# Patient Record
Sex: Female | Born: 1960 | Race: White | Hispanic: No | Marital: Married | Smoking: Former smoker
Health system: Southern US, Community
[De-identification: ages and names within clinical notes are randomized; demographics above are authoritative.]

## PROBLEM LIST (undated history)

## (undated) DIAGNOSIS — F32A Depression, unspecified: Secondary | ICD-10-CM

## (undated) DIAGNOSIS — J45909 Unspecified asthma, uncomplicated: Secondary | ICD-10-CM

## (undated) DIAGNOSIS — F329 Major depressive disorder, single episode, unspecified: Secondary | ICD-10-CM

## (undated) DIAGNOSIS — I219 Acute myocardial infarction, unspecified: Secondary | ICD-10-CM

## (undated) DIAGNOSIS — N83209 Unspecified ovarian cyst, unspecified side: Secondary | ICD-10-CM

## (undated) DIAGNOSIS — I10 Essential (primary) hypertension: Secondary | ICD-10-CM

## (undated) DIAGNOSIS — G459 Transient cerebral ischemic attack, unspecified: Secondary | ICD-10-CM

## (undated) DIAGNOSIS — H269 Unspecified cataract: Secondary | ICD-10-CM

## (undated) DIAGNOSIS — J449 Chronic obstructive pulmonary disease, unspecified: Secondary | ICD-10-CM

## (undated) DIAGNOSIS — E079 Disorder of thyroid, unspecified: Secondary | ICD-10-CM

## (undated) DIAGNOSIS — E119 Type 2 diabetes mellitus without complications: Secondary | ICD-10-CM

## (undated) HISTORY — DX: Essential (primary) hypertension: I10

## (undated) HISTORY — DX: Disorder of thyroid, unspecified: E07.9

## (undated) HISTORY — DX: Transient cerebral ischemic attack, unspecified: G45.9

## (undated) HISTORY — DX: Unspecified ovarian cyst, unspecified side: N83.209

## (undated) HISTORY — DX: Chronic obstructive pulmonary disease, unspecified: J44.9

## (undated) HISTORY — DX: Depression, unspecified: F32.A

## (undated) HISTORY — DX: Acute myocardial infarction, unspecified: I21.9

## (undated) HISTORY — DX: Type 2 diabetes mellitus without complications: E11.9

## (undated) HISTORY — PX: ABDOMINAL HYSTERECTOMY: SHX81

## (undated) HISTORY — PX: OTHER SURGICAL HISTORY: SHX169

## (undated) HISTORY — DX: Unspecified cataract: H26.9

## (undated) HISTORY — PX: CATARACT EXTRACTION, BILATERAL: SHX1313

## (undated) HISTORY — DX: Unspecified asthma, uncomplicated: J45.909

---
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
## 1998-07-20 ENCOUNTER — Emergency Department (HOSPITAL_COMMUNITY): Admission: EM | Admit: 1998-07-20 | Discharge: 1998-07-20 | Payer: Self-pay | Admitting: Emergency Medicine

## 1998-08-16 ENCOUNTER — Other Ambulatory Visit: Admission: RE | Admit: 1998-08-16 | Discharge: 1998-08-16 | Payer: Self-pay | Admitting: Internal Medicine

## 1998-12-16 ENCOUNTER — Emergency Department (HOSPITAL_COMMUNITY): Admission: EM | Admit: 1998-12-16 | Discharge: 1998-12-16 | Payer: Self-pay | Admitting: Emergency Medicine

## 1999-01-18 ENCOUNTER — Encounter: Admission: RE | Admit: 1999-01-18 | Discharge: 1999-04-18 | Payer: Self-pay | Admitting: Family Medicine

## 1999-09-05 ENCOUNTER — Other Ambulatory Visit: Admission: RE | Admit: 1999-09-05 | Discharge: 1999-09-05 | Payer: Self-pay | Admitting: Obstetrics and Gynecology

## 1999-10-30 ENCOUNTER — Encounter: Admission: RE | Admit: 1999-10-30 | Discharge: 1999-10-30 | Payer: Self-pay | Admitting: Internal Medicine

## 1999-10-30 ENCOUNTER — Encounter: Payer: Self-pay | Admitting: Internal Medicine

## 2000-11-06 ENCOUNTER — Emergency Department (HOSPITAL_COMMUNITY): Admission: EM | Admit: 2000-11-06 | Discharge: 2000-11-07 | Payer: Self-pay | Admitting: Emergency Medicine

## 2000-11-07 ENCOUNTER — Encounter: Payer: Self-pay | Admitting: Emergency Medicine

## 2001-01-01 ENCOUNTER — Encounter: Payer: Self-pay | Admitting: *Deleted

## 2001-01-01 ENCOUNTER — Encounter: Admission: RE | Admit: 2001-01-01 | Discharge: 2001-01-01 | Payer: Self-pay | Admitting: *Deleted

## 2001-01-22 ENCOUNTER — Encounter: Admission: RE | Admit: 2001-01-22 | Discharge: 2001-01-22 | Payer: Self-pay | Admitting: *Deleted

## 2001-01-22 ENCOUNTER — Encounter: Payer: Self-pay | Admitting: *Deleted

## 2001-01-26 ENCOUNTER — Emergency Department (HOSPITAL_COMMUNITY): Admission: EM | Admit: 2001-01-26 | Discharge: 2001-01-26 | Payer: Self-pay | Admitting: Emergency Medicine

## 2001-01-26 ENCOUNTER — Encounter: Payer: Self-pay | Admitting: Emergency Medicine

## 2001-01-28 ENCOUNTER — Encounter: Admission: RE | Admit: 2001-01-28 | Discharge: 2001-01-28 | Payer: Self-pay | Admitting: *Deleted

## 2001-01-28 ENCOUNTER — Encounter: Payer: Self-pay | Admitting: *Deleted

## 2003-07-10 ENCOUNTER — Emergency Department (HOSPITAL_COMMUNITY): Admission: EM | Admit: 2003-07-10 | Discharge: 2003-07-10 | Payer: Self-pay | Admitting: Emergency Medicine

## 2003-07-10 ENCOUNTER — Encounter: Payer: Self-pay | Admitting: Emergency Medicine

## 2003-11-27 ENCOUNTER — Emergency Department (HOSPITAL_COMMUNITY): Admission: EM | Admit: 2003-11-27 | Discharge: 2003-11-27 | Payer: Self-pay | Admitting: Emergency Medicine

## 2004-02-20 ENCOUNTER — Emergency Department (HOSPITAL_COMMUNITY): Admission: EM | Admit: 2004-02-20 | Discharge: 2004-02-20 | Payer: Self-pay | Admitting: Emergency Medicine

## 2004-03-01 ENCOUNTER — Encounter: Admission: RE | Admit: 2004-03-01 | Discharge: 2004-03-01 | Payer: Self-pay | Admitting: Orthopedic Surgery

## 2004-11-04 ENCOUNTER — Emergency Department (HOSPITAL_COMMUNITY): Admission: EM | Admit: 2004-11-04 | Discharge: 2004-11-04 | Payer: Self-pay | Admitting: Emergency Medicine

## 2005-03-17 ENCOUNTER — Other Ambulatory Visit: Admission: RE | Admit: 2005-03-17 | Discharge: 2005-03-17 | Payer: Self-pay | Admitting: Family Medicine

## 2005-03-20 ENCOUNTER — Encounter: Admission: RE | Admit: 2005-03-20 | Discharge: 2005-03-20 | Payer: Self-pay | Admitting: Family Medicine

## 2005-04-07 ENCOUNTER — Encounter: Admission: RE | Admit: 2005-04-07 | Discharge: 2005-07-06 | Payer: Self-pay | Admitting: Family Medicine

## 2007-01-16 ENCOUNTER — Emergency Department (HOSPITAL_COMMUNITY): Admission: EM | Admit: 2007-01-16 | Discharge: 2007-01-16 | Payer: Self-pay | Admitting: Podiatry

## 2007-05-03 ENCOUNTER — Emergency Department (HOSPITAL_COMMUNITY): Admission: EM | Admit: 2007-05-03 | Discharge: 2007-05-04 | Payer: Self-pay | Admitting: Emergency Medicine

## 2008-05-16 ENCOUNTER — Emergency Department (HOSPITAL_COMMUNITY): Admission: EM | Admit: 2008-05-16 | Discharge: 2008-05-16 | Payer: Self-pay | Admitting: Emergency Medicine

## 2008-09-26 ENCOUNTER — Encounter: Admission: RE | Admit: 2008-09-26 | Discharge: 2008-09-26 | Payer: Self-pay | Admitting: Family Medicine

## 2010-02-18 ENCOUNTER — Inpatient Hospital Stay (HOSPITAL_COMMUNITY): Admission: RE | Admit: 2010-02-18 | Discharge: 2010-02-21 | Payer: Self-pay | Admitting: Obstetrics and Gynecology

## 2010-02-18 ENCOUNTER — Encounter (INDEPENDENT_AMBULATORY_CARE_PROVIDER_SITE_OTHER): Payer: Self-pay | Admitting: Obstetrics and Gynecology

## 2010-03-09 ENCOUNTER — Inpatient Hospital Stay (HOSPITAL_COMMUNITY): Admission: AD | Admit: 2010-03-09 | Discharge: 2010-03-09 | Payer: Self-pay | Admitting: Obstetrics and Gynecology

## 2010-03-10 ENCOUNTER — Observation Stay (HOSPITAL_COMMUNITY): Admission: AD | Admit: 2010-03-10 | Discharge: 2010-03-12 | Payer: Self-pay | Admitting: Obstetrics and Gynecology

## 2010-03-11 ENCOUNTER — Ambulatory Visit: Payer: Self-pay | Admitting: Cardiology

## 2010-04-11 ENCOUNTER — Emergency Department (HOSPITAL_COMMUNITY): Admission: EM | Admit: 2010-04-11 | Discharge: 2010-04-11 | Payer: Self-pay | Admitting: Emergency Medicine

## 2010-07-04 ENCOUNTER — Emergency Department (HOSPITAL_COMMUNITY)
Admission: EM | Admit: 2010-07-04 | Discharge: 2010-07-04 | Payer: Self-pay | Source: Home / Self Care | Admitting: Emergency Medicine

## 2011-03-01 LAB — POCT I-STAT, CHEM 8
Calcium, Ion: 1.15 mmol/L (ref 1.12–1.32)
Glucose, Bld: 150 mg/dL — ABNORMAL HIGH (ref 70–99)
Hemoglobin: 12.6 g/dL (ref 12.0–15.0)

## 2011-03-01 LAB — URINALYSIS, ROUTINE W REFLEX MICROSCOPIC
Glucose, UA: NEGATIVE mg/dL
Hgb urine dipstick: NEGATIVE
Protein, ur: NEGATIVE mg/dL
Urobilinogen, UA: 0.2 mg/dL (ref 0.0–1.0)

## 2011-03-01 LAB — DIFFERENTIAL
Basophils Absolute: 0.1 10*3/uL (ref 0.0–0.1)
Eosinophils Absolute: 0.4 10*3/uL (ref 0.0–0.7)
Neutro Abs: 5.5 10*3/uL (ref 1.7–7.7)

## 2011-03-01 LAB — CBC
MCH: 25.8 pg — ABNORMAL LOW (ref 26.0–34.0)
Platelets: 457 10*3/uL — ABNORMAL HIGH (ref 150–400)
RBC: 4.41 MIL/uL (ref 3.87–5.11)

## 2011-03-01 LAB — URINE MICROSCOPIC-ADD ON

## 2011-03-01 LAB — POCT CARDIAC MARKERS: Troponin i, poc: <0.05 ng/mL (ref 0.00–0.09)

## 2011-03-04 LAB — BASIC METABOLIC PANEL
BUN: 17 mg/dL (ref 6–23)
CO2: 23 mEq/L (ref 19–32)
Calcium: 8.4 mg/dL (ref 8.4–10.5)
Creatinine, Ser: 0.66 mg/dL (ref 0.4–1.2)
GFR calc Af Amer: >60 mL/min (ref >60)
GFR calc non Af Amer: >60 mL/min (ref >60)
Glucose, Bld: 107 mg/dL — ABNORMAL HIGH (ref 70–99)
Sodium: 136 mEq/L (ref 135–145)

## 2011-03-04 LAB — POCT I-STAT, CHEM 8
Creatinine, Ser: 0.4 mg/dL (ref 0.4–1.2)
Glucose, Bld: 104 mg/dL — ABNORMAL HIGH (ref 70–99)
Hemoglobin: 10.5 g/dL — ABNORMAL LOW (ref 12.0–15.0)

## 2011-03-04 LAB — CBC
HCT: 29.3 % — ABNORMAL LOW (ref 36.0–46.0)
Hemoglobin: 9.8 g/dL — ABNORMAL LOW (ref 12.0–15.0)
RBC: 3.68 MIL/uL — ABNORMAL LOW (ref 3.87–5.11)
WBC: 10.5 10*3/uL (ref 4.0–10.5)

## 2011-03-04 LAB — DIFFERENTIAL
Basophils Absolute: 0.1 10*3/uL (ref 0.0–0.1)
Basophils Relative: 1 % (ref 0–1)
Lymphocytes Relative: 28 % (ref 12–46)
Lymphs Abs: 2.9 10*3/uL (ref 0.7–4.0)
Monocytes Absolute: 1.1 10*3/uL — ABNORMAL HIGH (ref 0.1–1.0)
Neutro Abs: 6.1 10*3/uL (ref 1.7–7.7)
Neutrophils Relative %: 58 % (ref 43–77)

## 2011-03-10 LAB — GLUCOSE, CAPILLARY
Glucose-Capillary: 108 mg/dL — ABNORMAL HIGH (ref 70–99)
Glucose-Capillary: 112 mg/dL — ABNORMAL HIGH (ref 70–99)
Glucose-Capillary: 113 mg/dL — ABNORMAL HIGH (ref 70–99)
Glucose-Capillary: 118 mg/dL — ABNORMAL HIGH (ref 70–99)
Glucose-Capillary: 130 mg/dL — ABNORMAL HIGH (ref 70–99)
Glucose-Capillary: 132 mg/dL — ABNORMAL HIGH (ref 70–99)
Glucose-Capillary: 135 mg/dL — ABNORMAL HIGH (ref 70–99)
Glucose-Capillary: 162 mg/dL — ABNORMAL HIGH (ref 70–99)
Glucose-Capillary: 201 mg/dL — ABNORMAL HIGH (ref 70–99)
Glucose-Capillary: 222 mg/dL — ABNORMAL HIGH (ref 70–99)
Glucose-Capillary: 230 mg/dL — ABNORMAL HIGH (ref 70–99)
Glucose-Capillary: 287 mg/dL — ABNORMAL HIGH (ref 70–99)
Glucose-Capillary: 88 mg/dL (ref 70–99)
Glucose-Capillary: 96 mg/dL (ref 70–99)

## 2011-03-10 LAB — COMPREHENSIVE METABOLIC PANEL
ALT: 22 U/L (ref 0–35)
AST: 27 U/L (ref 0–37)
Alkaline Phosphatase: 87 U/L (ref 39–117)
GFR calc Af Amer: >60 mL/min (ref >60)
Glucose, Bld: 194 mg/dL — ABNORMAL HIGH (ref 70–99)
Potassium: 4 mEq/L (ref 3.5–5.1)
Sodium: 138 mEq/L (ref 135–145)
Total Protein: 5.8 g/dL — ABNORMAL LOW (ref 6.0–8.3)

## 2011-03-10 LAB — BASIC METABOLIC PANEL
BUN: 10 mg/dL (ref 6–23)
BUN: 16 mg/dL (ref 6–23)
CO2: 25 mEq/L (ref 19–32)
CO2: 28 mEq/L (ref 19–32)
Calcium: 9 mg/dL (ref 8.4–10.5)
Chloride: 104 mEq/L (ref 96–112)
Chloride: 105 mEq/L (ref 96–112)
Chloride: 99 mEq/L (ref 96–112)
Creatinine, Ser: 0.58 mg/dL (ref 0.4–1.2)
GFR calc Af Amer: >60 mL/min (ref >60)
GFR calc Af Amer: >60 mL/min (ref >60)
Glucose, Bld: 109 mg/dL — ABNORMAL HIGH (ref 70–99)
Glucose, Bld: 191 mg/dL — ABNORMAL HIGH (ref 70–99)
Potassium: 3.6 mEq/L (ref 3.5–5.1)
Potassium: 4.1 mEq/L (ref 3.5–5.1)
Potassium: 4.5 mEq/L (ref 3.5–5.1)
Sodium: 137 mEq/L (ref 135–145)

## 2011-03-10 LAB — CBC
HCT: 26.6 % — ABNORMAL LOW (ref 36.0–46.0)
HCT: 28.5 % — ABNORMAL LOW (ref 36.0–46.0)
HCT: 30.7 % — ABNORMAL LOW (ref 36.0–46.0)
HCT: 35.9 % — ABNORMAL LOW (ref 36.0–46.0)
Hemoglobin: 8.6 g/dL — ABNORMAL LOW (ref 12.0–15.0)
Hemoglobin: 9.2 g/dL — ABNORMAL LOW (ref 12.0–15.0)
MCHC: 32.4 g/dL (ref 30.0–36.0)
MCV: 81.4 fL (ref 78.0–100.0)
MCV: 83.1 fL (ref 78.0–100.0)
MCV: 83.5 fL (ref 78.0–100.0)
Platelets: 459 10*3/uL — ABNORMAL HIGH (ref 150–400)
Platelets: 570 10*3/uL — ABNORMAL HIGH (ref 150–400)
Platelets: 613 10*3/uL — ABNORMAL HIGH (ref 150–400)
RBC: 3.67 MIL/uL — ABNORMAL LOW (ref 3.87–5.11)
RDW: 15.8 % — ABNORMAL HIGH (ref 11.5–15.5)
WBC: 10.2 10*3/uL (ref 4.0–10.5)
WBC: 15.8 10*3/uL — ABNORMAL HIGH (ref 4.0–10.5)
WBC: 8.3 10*3/uL (ref 4.0–10.5)

## 2011-03-10 LAB — MRSA PCR SCREENING: MRSA by PCR: NEGATIVE

## 2011-04-16 ENCOUNTER — Emergency Department (HOSPITAL_COMMUNITY): Payer: BC Managed Care – PPO

## 2011-04-16 ENCOUNTER — Inpatient Hospital Stay (HOSPITAL_COMMUNITY)
Admission: EM | Admit: 2011-04-16 | Discharge: 2011-04-21 | DRG: 122 | Disposition: A | Discharge status: Home / Self Care | Payer: BC Managed Care – PPO | Source: Ambulatory Visit | Attending: Family Medicine | Admitting: Family Medicine

## 2011-04-16 DIAGNOSIS — E119 Type 2 diabetes mellitus without complications: Secondary | ICD-10-CM | POA: Diagnosis present

## 2011-04-16 DIAGNOSIS — Z7902 Long term (current) use of antithrombotics/antiplatelets: Secondary | ICD-10-CM

## 2011-04-16 DIAGNOSIS — E7800 Pure hypercholesterolemia, unspecified: Secondary | ICD-10-CM | POA: Diagnosis present

## 2011-04-16 DIAGNOSIS — I10 Essential (primary) hypertension: Secondary | ICD-10-CM | POA: Diagnosis present

## 2011-04-16 DIAGNOSIS — J449 Chronic obstructive pulmonary disease, unspecified: Secondary | ICD-10-CM | POA: Diagnosis present

## 2011-04-16 DIAGNOSIS — R55 Syncope and collapse: Secondary | ICD-10-CM | POA: Diagnosis present

## 2011-04-16 DIAGNOSIS — J4489 Other specified chronic obstructive pulmonary disease: Secondary | ICD-10-CM | POA: Diagnosis present

## 2011-04-16 DIAGNOSIS — I2510 Atherosclerotic heart disease of native coronary artery without angina pectoris: Secondary | ICD-10-CM | POA: Diagnosis present

## 2011-04-16 DIAGNOSIS — I214 Non-ST elevation (NSTEMI) myocardial infarction: Principal | ICD-10-CM | POA: Diagnosis present

## 2011-04-16 DIAGNOSIS — K219 Gastro-esophageal reflux disease without esophagitis: Secondary | ICD-10-CM | POA: Diagnosis present

## 2011-04-16 DIAGNOSIS — F3289 Other specified depressive episodes: Secondary | ICD-10-CM | POA: Diagnosis present

## 2011-04-16 DIAGNOSIS — F329 Major depressive disorder, single episode, unspecified: Secondary | ICD-10-CM | POA: Diagnosis present

## 2011-04-16 DIAGNOSIS — N318 Other neuromuscular dysfunction of bladder: Secondary | ICD-10-CM | POA: Diagnosis present

## 2011-04-16 DIAGNOSIS — E039 Hypothyroidism, unspecified: Secondary | ICD-10-CM | POA: Diagnosis present

## 2011-04-16 DIAGNOSIS — E8881 Metabolic syndrome: Secondary | ICD-10-CM | POA: Diagnosis present

## 2011-04-16 DIAGNOSIS — F17200 Nicotine dependence, unspecified, uncomplicated: Secondary | ICD-10-CM | POA: Diagnosis present

## 2011-04-16 DIAGNOSIS — E785 Hyperlipidemia, unspecified: Secondary | ICD-10-CM | POA: Diagnosis present

## 2011-04-16 DIAGNOSIS — G4733 Obstructive sleep apnea (adult) (pediatric): Secondary | ICD-10-CM | POA: Diagnosis present

## 2011-04-16 LAB — CBC
HCT: 39.4 % (ref 36.0–46.0)
Hemoglobin: 13.7 g/dL (ref 12.0–15.0)
MCHC: 34.8 g/dL (ref 30.0–36.0)
MCV: 88.5 fL (ref 78.0–100.0)
RDW: 14.2 % (ref 11.5–15.5)
WBC: 10.7 10*3/uL — ABNORMAL HIGH (ref 4.0–10.5)

## 2011-04-16 LAB — RAPID URINE DRUG SCREEN, HOSP PERFORMED
Amphetamines: NOT DETECTED
Benzodiazepines: NOT DETECTED
Cocaine: NOT DETECTED
Opiates: NOT DETECTED
Tetrahydrocannabinol: NOT DETECTED

## 2011-04-16 LAB — DIFFERENTIAL
Basophils Absolute: 0.1 10*3/uL (ref 0.0–0.1)
Eosinophils Relative: 2 % (ref 0–5)
Lymphocytes Relative: 31 % (ref 12–46)
Lymphs Abs: 3.3 10*3/uL (ref 0.7–4.0)
Monocytes Absolute: 1 10*3/uL (ref 0.1–1.0)
Neutro Abs: 6.1 10*3/uL (ref 1.7–7.7)

## 2011-04-16 LAB — BASIC METABOLIC PANEL
BUN: 20 mg/dL (ref 6–23)
CO2: 21 mEq/L (ref 19–32)
GFR calc non Af Amer: >60 mL/min (ref >60)
Glucose, Bld: 166 mg/dL — ABNORMAL HIGH (ref 70–99)
Potassium: 3.6 mEq/L (ref 3.5–5.1)
Sodium: 134 mEq/L — ABNORMAL LOW (ref 135–145)

## 2011-04-16 LAB — LIPID PANEL
Cholesterol: 227 mg/dL — ABNORMAL HIGH (ref 0–200)
HDL: 28 mg/dL — ABNORMAL LOW (ref >39)
LDL Cholesterol: UNDETERMINED mg/dL (ref 0–99)
Total CHOL/HDL Ratio: 8.1 RATIO
Triglycerides: 436 mg/dL — ABNORMAL HIGH (ref <150)

## 2011-04-16 LAB — URINALYSIS, ROUTINE W REFLEX MICROSCOPIC
Bilirubin Urine: NEGATIVE
Hgb urine dipstick: NEGATIVE
Ketones, ur: 15 mg/dL — AB
Specific Gravity, Urine: 1.026 (ref 1.005–1.030)
pH: 5.5 (ref 5.0–8.0)

## 2011-04-16 LAB — POCT CARDIAC MARKERS: Troponin i, poc: <0.05 ng/mL (ref 0.00–0.09)

## 2011-04-16 LAB — GLUCOSE, CAPILLARY

## 2011-04-17 LAB — GLUCOSE, CAPILLARY
Glucose-Capillary: 146 mg/dL — ABNORMAL HIGH (ref 70–99)
Glucose-Capillary: 155 mg/dL — ABNORMAL HIGH (ref 70–99)
Glucose-Capillary: 242 mg/dL — ABNORMAL HIGH (ref 70–99)

## 2011-04-17 LAB — PROTIME-INR
INR: 1.08 (ref 0.00–1.49)
Prothrombin Time: 14.2 seconds (ref 11.6–15.2)

## 2011-04-17 LAB — HEMOGLOBIN A1C
Hgb A1c MFr Bld: 9 % — ABNORMAL HIGH (ref <5.7)
Mean Plasma Glucose: 212 mg/dL — ABNORMAL HIGH (ref <117)

## 2011-04-17 LAB — DIFFERENTIAL
Basophils Absolute: 0 10*3/uL (ref 0.0–0.1)
Eosinophils Relative: 5 % (ref 0–5)
Lymphocytes Relative: 40 % (ref 12–46)
Neutro Abs: 3.5 10*3/uL (ref 1.7–7.7)
Neutrophils Relative %: 47 % (ref 43–77)

## 2011-04-17 LAB — CBC
HCT: 38.5 % (ref 36.0–46.0)
RBC: 4.26 MIL/uL (ref 3.87–5.11)
RDW: 14.5 % (ref 11.5–15.5)
WBC: 7.5 10*3/uL (ref 4.0–10.5)

## 2011-04-17 LAB — CARDIAC PANEL(CRET KIN+CKTOT+MB+TROPI)
CK, MB: 1.5 ng/mL (ref 0.3–4.0)
Relative Index: INVALID (ref 0.0–2.5)
Relative Index: INVALID (ref 0.0–2.5)
Relative Index: INVALID (ref 0.0–2.5)
Troponin I: 1.3 ng/mL (ref <0.30)
Troponin I: 1.26 ng/mL (ref <0.30)

## 2011-04-18 DIAGNOSIS — R55 Syncope and collapse: Secondary | ICD-10-CM

## 2011-04-18 LAB — GLUCOSE, CAPILLARY: Glucose-Capillary: 182 mg/dL — ABNORMAL HIGH (ref 70–99)

## 2011-04-18 LAB — COMPREHENSIVE METABOLIC PANEL
AST: 79 U/L — ABNORMAL HIGH (ref 0–37)
Albumin: 3.1 g/dL — ABNORMAL LOW (ref 3.5–5.2)
Calcium: 8.8 mg/dL (ref 8.4–10.5)
Chloride: 106 mEq/L (ref 96–112)
Creatinine, Ser: 0.65 mg/dL (ref 0.4–1.2)
GFR calc Af Amer: >60 mL/min (ref >60)

## 2011-04-19 ENCOUNTER — Inpatient Hospital Stay (HOSPITAL_COMMUNITY): Payer: BC Managed Care – PPO

## 2011-04-19 LAB — GLUCOSE, CAPILLARY
Glucose-Capillary: 176 mg/dL — ABNORMAL HIGH (ref 70–99)
Glucose-Capillary: 287 mg/dL — ABNORMAL HIGH (ref 70–99)
Glucose-Capillary: 218 mg/dL — ABNORMAL HIGH (ref 70–99)

## 2011-04-19 LAB — CARDIAC PANEL(CRET KIN+CKTOT+MB+TROPI): Total CK: 86 U/L (ref 7–177)

## 2011-04-19 LAB — COMPREHENSIVE METABOLIC PANEL
Alkaline Phosphatase: 93 U/L (ref 39–117)
Alkaline Phosphatase: 93 U/L (ref 39–117)
BUN: 13 mg/dL (ref 6–23)
BUN: 11 mg/dL (ref 6–23)
CO2: 28 mEq/L (ref 19–32)
Calcium: 9.3 mg/dL (ref 8.4–10.5)
GFR calc non Af Amer: >60 mL/min (ref >60)
Glucose, Bld: 160 mg/dL — ABNORMAL HIGH (ref 70–99)
Glucose, Bld: 258 mg/dL — ABNORMAL HIGH (ref 70–99)
Potassium: 3.8 mEq/L (ref 3.5–5.1)
Total Bilirubin: 0.3 mg/dL (ref 0.3–1.2)
Total Protein: 6.8 g/dL (ref 6.0–8.3)
Total Protein: 6.6 g/dL (ref 6.0–8.3)

## 2011-04-19 LAB — CBC
MCH: 29.7 pg (ref 26.0–34.0)
Platelets: 300 10*3/uL (ref 150–400)
RBC: 4.14 MIL/uL (ref 3.87–5.11)
RDW: 14.2 % (ref 11.5–15.5)
WBC: 6.7 10*3/uL (ref 4.0–10.5)

## 2011-04-19 LAB — D-DIMER, QUANTITATIVE: D-Dimer, Quant: 4 ug/mL-FEU — ABNORMAL HIGH (ref 0.00–0.48)

## 2011-04-20 ENCOUNTER — Inpatient Hospital Stay (HOSPITAL_COMMUNITY): Payer: BC Managed Care – PPO

## 2011-04-20 LAB — GLUCOSE, CAPILLARY
Glucose-Capillary: 130 mg/dL — ABNORMAL HIGH (ref 70–99)
Glucose-Capillary: 229 mg/dL — ABNORMAL HIGH (ref 70–99)
Glucose-Capillary: 193 mg/dL — ABNORMAL HIGH (ref 70–99)

## 2011-04-20 LAB — CBC
MCHC: 32.8 g/dL (ref 30.0–36.0)
RDW: 14.5 % (ref 11.5–15.5)
WBC: 10 10*3/uL (ref 4.0–10.5)

## 2011-04-20 LAB — CARDIAC PANEL(CRET KIN+CKTOT+MB+TROPI): CK, MB: 1.4 ng/mL (ref 0.3–4.0)

## 2011-04-20 LAB — COMPREHENSIVE METABOLIC PANEL
BUN: 10 mg/dL (ref 6–23)
Calcium: 9 mg/dL (ref 8.4–10.5)
Creatinine, Ser: 0.49 mg/dL (ref 0.4–1.2)
Glucose, Bld: 133 mg/dL — ABNORMAL HIGH (ref 70–99)
Total Protein: 6.6 g/dL (ref 6.0–8.3)

## 2011-04-20 LAB — PROTIME-INR
INR: 1.05 (ref 0.00–1.49)
Prothrombin Time: 13.9 seconds (ref 11.6–15.2)

## 2011-04-20 MED ORDER — XENON XE 133 GAS
10.0000 | GAS_FOR_INHALATION | Freq: Once | RESPIRATORY_TRACT | Status: AC | PRN
  Administered 2011-04-20: 10 via RESPIRATORY_TRACT

## 2011-04-20 MED ORDER — TECHNETIUM TO 99M ALBUMIN AGGREGATED
6.0000 | Freq: Once | INTRAVENOUS | Status: AC | PRN
  Administered 2011-04-20: 6 via INTRAVENOUS

## 2011-04-21 ENCOUNTER — Inpatient Hospital Stay (HOSPITAL_COMMUNITY): Payer: BC Managed Care – PPO

## 2011-04-21 LAB — BASIC METABOLIC PANEL WITH GFR
BUN: 11 mg/dL (ref 6–23)
CO2: 27 meq/L (ref 19–32)
Calcium: 9.1 mg/dL (ref 8.4–10.5)
Chloride: 102 meq/L (ref 96–112)
Creatinine, Ser: 0.57 mg/dL (ref 0.4–1.2)
GFR calc non Af Amer: >60 mL/min
Glucose, Bld: 149 mg/dL — ABNORMAL HIGH (ref 70–99)
Potassium: 4.2 meq/L (ref 3.5–5.1)
Sodium: 136 meq/L (ref 135–145)

## 2011-04-21 LAB — PROTIME-INR
INR: 1.1 (ref 0.00–1.49)
Prothrombin Time: 14.4 s (ref 11.6–15.2)

## 2011-04-21 LAB — GLUCOSE, CAPILLARY: Glucose-Capillary: 146 mg/dL — ABNORMAL HIGH (ref 70–99)

## 2011-04-22 NOTE — Procedures (Unsigned)
REFERRING PHYSICIAN:  John C.  HISTORY:  A 50 year old woman admitted for the chest pain status post cardiac catheterization.  The patient had episodes of dizziness and unresponsiveness with shaking without any postictal period.  EEG has been requested for further evaluation.  MEDICATIONS:  Lopressor, vitamin B12, Paxil, Zocor, Vasotec, Flovent, Ventolin, levothyroxine, ferrous sulfate, Lovenox, Amaryl, K-Dur, Ativan, and Percocet.  EEG DURATION:  21.5 minutes of EEG recording.  EEG DESCRIPTION:  This is a routine 18 channel adult EEG recording with one channel devoted to limited EKG recording.  Activation procedure was performed during the photic stimulation and the study is performed in awake and drowsy state. As the EEG opens up, I noticed a posterior dominant rhythm is 9 Hz frequency with an amplitude of 15-28 microvolts.  The rhythm is symmetrical and continuous.  There is mild sub harmonic driving noted with the photic stimulation in posterior lead.  There is also significant EMG, artifact throughout the study.  I do notice the vertex waves and well-formed synchronous spindles as the patient got drowsy during the study.  There is no evidence to suggest epileptiform discharges or electrographic seizures on this study.  EEG INTERPRETATION:  This is a normal awake and drowsy EEG.  There is no evidence to suggest electrographic seizures or epileptiform discharges based on today's study.          ______________________________ Levie Heritage, MD    [id number] D:  04/21/2011 13:01:37  T:  04/22/2011 01:06:35  Job #:  [id number]

## 2011-04-22 NOTE — Discharge Summary (Signed)
[name], INFANTINO                ACCOUNT NO.:  [id number]  MEDICAL RECORD NO.:  [id number]           PATIENT TYPE:  I  LOCATION:  2030                         FACILITY:  [facility]  PHYSICIAN:  Standley Dakins, MD   DATE OF BIRTH:  1961-11-30  DATE OF ADMISSION:  04/16/2011 DATE OF DISCHARGE:  04/21/2011                        DISCHARGE SUMMARY - CLOSE OUTPATIENT FOLLOWUP   PRIMARY CARE PROVIDER:  Fleet Contras, MD.  The patient will follow up with him in 10 days.  CARDIOLOGISTEduardo Osier Sharyn Lull, MD  DISCHARGE MEDICATIONS: 1. Levothyroxine 200 mcg 1 p.o. daily. 2. Metoprolol 25 mg 1 p.o. b.i.d. 3. Plavix 75 mg 1 p.o. daily. 4. Detrol LA 4 mg 1 p.o. daily. 5. Enalapril 5 mg 1 p.o. daily. 6. Fexofenadine OTC b.i.d. p.r.n. 7. Flovent 2 puffs inhaled daily. 8. Lovastatin 40 mg 1 p.o. daily at bedtime. 9. Metformin 1000 mg 1 p.o. b.i.d. 10.Paroxetine 20 mg 1 p.o. daily. 11.ProAir inhaler 2 puffs q.4 h. p.r.n.  DISCHARGE DIAGNOSES: 1. Non-ST elevation myocardial infarction. 2. Metabolic syndrome. 3. Dyslipidemia. 4. Hypothyroidism - uncontrolled. 5. Obstructive sleep apnea. 6. Hypertension. 7. Morbid obesity. 8. Overactive bladder and urge incontinence. 9. Asthma. 10.Depression. 11.Diabetes mellitus type 2, poorly controlled, non-insulin requiring.  HOSPITAL COURSE:  Briefly, this patient was admitted into the hospital with chest pain 10/10 that radiated into her back.  She was admitted into the hospital and cardiac enzymes were monitored serially.  She began to develop elevated troponin levels and cardiology consultation was obtained.  The patient was taken for cardiac catheterization by Dr. Sharyn Lull.  Their findings revealed left ventricle showed good LV systolic function with an EF of 55%.  Left main was short but patent.  LAD was large and patent.  Diagonal 1 and 2 were moderate size and were patent. Diagonal 3 was also patent.  The left circumflex was patent as  well. RCA was large and patent.    The patient was monitored in the hospital. She began to develop several episodes of orthostatic vasovagal syncope and passed out several times in the hospital.  I talked with a neurologist who recommended that she have some outpatient followup with a neurologist to evaluate her syncopal episodes.  He thought that they were orthostatic and that outpatient followup was appropriate.  The patient has been worked up for this approximately 1 year ago for similar episodes.  The patient improved considerably after fluid boluses.  In addition, the patient had an elevated D-dimer.    A V/Q scan was obtained and was negative for pulmonary embolus.  The patient had recurrent episodes of chest pain and an EKG was obtained and cardiac enzymes were obtained and there was no acute findings.  The patient does have obstructive sleep apnea and was found to have uncontrolled diabetes and her hypothyroidism was also uncontrolled.  I have recommended that she follow up with a primary care provider.  We arranged an outpatient appointment for her.  Also she will follow up with Dr. Sharyn Lull, her cardiologist that saw her in the hospital and have recommended an outpatient appointment for her to see [facility]  for evaluation of her syncopal episodes.  The patient was discharged in stable condition.  FOLLOWUP:  She is going to follow up with Dr. Sharyn Lull, her cardiologist. I have recommended 7-10 days for followup.  She will follow up with Dr. Concepcion Elk for primary care as scheduled in approximately 10 days.  Also I have recommended followup with [facility] for evaluation of her syncopal episodes thought to be vasovagal syncope.  SPECIAL INSTRUCTIONS:  Please take your medications as prescribed. Please follow up with the appointment scheduled and please monitor blood glucose by checking blood sugars at least one time per day and as needed.  Also,  fall precautions strongly recommended as the patient has history of vaso-vagal syncopal episodes.  I have strongly recommended that the patient have an outpatient sleep study and be fitted for CPAP.  The patient has been diagnosed with sleep apnea in the past and has not been compliant with CPAP.  I spent 35 mins preparing the discharge, writing prescriptions and  arranging follow-up.     Standley Dakins, MD, CDE, FAAFP     CJ/MEDQ  D:  04/21/2011  T:  04/21/2011  Job:  [id number]  cc:   [facility] Eduardo Osier. Sharyn Lull, M.D. Fleet Contras, M.D.  Electronically Signed by Standley Dakins  on 04/22/2011 04:48:37 PM
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
## 2011-04-29 NOTE — Procedures (Signed)
[name], [name]                ACCOUNT NO.:  [id number]   MEDICAL RECORD NO.:  [id number]          PATIENT TYPE:  OUT   LOCATION:  SLEEP CENTER                 FACILITY:  [facility]   PHYSICIAN:  Barbaraann Share, MD,FCCPDATE OF BIRTH:  1961/12/07   DATE OF STUDY:  06/25/2008                            NOCTURNAL POLYSOMNOGRAM   REFERRING PHYSICIAN:   INDICATION FOR STUDY:  Hypersomnia with sleep apnea.  The patient has  documented sleep apnea and returns for pressure optimization.   EPWORTH SLEEPINESS SCORE:  Two.   SLEEP ARCHITECTURE:  The patient had a total sleep time of 237 minutes  with no slow-wave sleep and decreased quantity of REM.  Sleep onset  latency was prolonged at 48 minutes, and REM onset was very prolonged at  278 minutes.  Sleep efficiency was very decreased at 60%.   RESPIRATORY DATA:  The patient was fitted with an extra small Quattro  full face mask at the start of the study, and the pressure was then  increased incrementally to control both obstructive events and snoring.  At a final pressure of 10 cm of water, the patient had excellent control  of her sleep apnea even through REM.  It should be noted, however, there  was no supine sleep time on the final CPAP pressure.   OXYGEN DATA:  There was O2 desaturation as low as 88% prior to reaching  optimal CPAP pressure.   CARDIAC DATA:  No clinically significant arrhythmias were noted.   MOVEMENT/PARASOMNIA:  There were no abnormal leg jerks or behaviors  noted.   IMPRESSION/RECOMMENDATION:  Good control of previously documented  obstructive sleep apnea with 10 cm of continuous positive airway  pressure delivered by an extra small Quattro full face mask.  The  patient had excellent control during rapid eye movement but never had  supine sleep on the final CPAP pressure.  She should be encouraged to  work aggressively on weight loss.      Barbaraann Share, MD,FCCP  Diplomate, American Board of Sleep  Medicine  Electronically Signed    [facility]/MEDQ  D:  07/06/2008 19:35:18  T:  07/06/2008 20:07:32  Job:  [id number]

## 2011-04-29 NOTE — Procedures (Signed)
[name], [name]                ACCOUNT NO.:  [id number]   MEDICAL RECORD NO.:  [id number]          PATIENT TYPE:  OUT   LOCATION:  SLEEP CENTER                 FACILITY:  [facility]   PHYSICIAN:  Barbaraann Share, MD,FCCPDATE OF BIRTH:  1961-02-21   DATE OF STUDY:  06/25/2008                            NOCTURNAL POLYSOMNOGRAM   REFERRING PHYSICIAN:   This is the second dictation.  The first one was apparently lost and  never came through.   REFERRING PHYSICIAN:  Arta Silence, M.D.   INDICATION FOR STUDY:  Hypersomnia with sleep apnea.  Patient returns  for formal CPAP titration.   EPWORTH SLEEPINESS SCORE:  2.   SLEEP ARCHITECTURE:  Patient had a total sleep time of 237 minutes with  no slow wave sleep and decreased REM.  Sleep onset latency was prolonged  at 48 minutes, and REM onset was prolonged at 278 minutes.  Sleep  efficiency was decreased at 60%.   RESPIRATORY DATA:  The patient underwent a CPAP titration study and was  fitted with a ResMed Quattro full face mask, size extra small.  Patient  was started on a pressure of 5 cm/H2O and gradually increased to 10 cm  to treat both obstructive events and snoring.  On the final CPAP  pressure, the patient began to have REM.  Overall, tolerance appeared to  be excellent.   OXYGEN DATA:  The patient had transient O2 desaturation as low as 88%  prior to reaching optimal CPAP pressure.   CARDIAC DATA:  No cardiac arrhythmias were noted.   MOVEMENT-PARASOMNIA:  The patient was found to have no significant leg  jerks or abnormal behavior.   IMPRESSIONS-RECOMMENDATIONS:  Good control of previously documented  obstructive sleep apnea with 10 cm of CPAP delivered by an extra small  ResMed Quattro full face mask.  Patient should also be encouraged to  work aggressively on weight loss.      Barbaraann Share, MD,FCCP  Diplomate, American Board of Sleep  Medicine  Electronically Signed     [facility]/MEDQ  D:  07/25/2008  15:00:23  T:  07/25/2008 15:40:35  Job:  [id number]
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
## 2011-09-11 LAB — CBC
HCT: 36.8
Hemoglobin: 12.5
MCV: 86.5
RBC: 4.25
WBC: 13.9 — ABNORMAL HIGH

## 2011-09-11 LAB — URINALYSIS, ROUTINE W REFLEX MICROSCOPIC
Bilirubin Urine: NEGATIVE
Specific Gravity, Urine: 1.01
Urobilinogen, UA: 0.2
pH: 5.5

## 2011-09-11 LAB — COMPREHENSIVE METABOLIC PANEL
Alkaline Phosphatase: 117
BUN: 11
CO2: 24
Chloride: 99
Creatinine, Ser: 0.91
GFR calc non Af Amer: >60
Total Bilirubin: 1

## 2011-09-11 LAB — URINE MICROSCOPIC-ADD ON

## 2011-09-11 LAB — POCT CARDIAC MARKERS
Operator id: 275321
Troponin i, poc: <0.05

## 2011-09-11 LAB — DIFFERENTIAL
Basophils Absolute: 0.1
Basophils Relative: 1
Eosinophils Relative: 1
Lymphocytes Relative: 17
Neutro Abs: 9.5 — ABNORMAL HIGH

## 2011-09-11 LAB — URINE CULTURE

## 2013-02-02 IMAGING — NM NM PULMONARY VENT & PERF
2 series · 12 of 12 positions shown · non-contrast
Comparison: Chest radiograph from 04/16/2011

CLINICAL DATA: 50-year-old with elevated D-dimer.

NM PULMONARY VENTILATION AND PERFUSION SCAN
Radiopharmaceutical: 10 mCi xenon-822,  6 mCi technetium 99m MAA

[vq scan · 3.20mm/px · 6 of 20 frames shown (1 of 2)]
[frame 2/20  full-range]
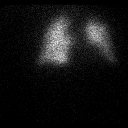
[frame 5/20  full-range]
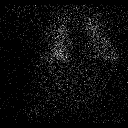
[frame 9/20  full-range]
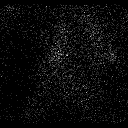
[frame 12/20]
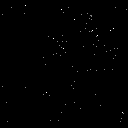
[frame 15/20]
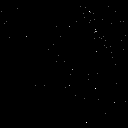
[frame 19/20  full-range]
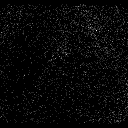

[vq scan · 3.20mm/px · 6 of 20 frames shown (2 of 2)]
[frame 2/20  full-range]
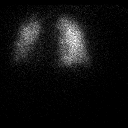
[frame 5/20  full-range]
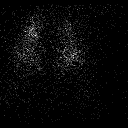
[frame 9/20  full-range]
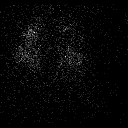
[frame 12/20]
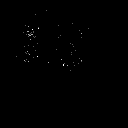
[frame 15/20]
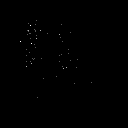
[frame 19/20]
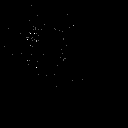

[12 of 12 positions shown; findings below may reference images not displayed]

FINDINGS: The ventilation images are normal.  The perfusion images
are essentially normal.  There is no significant peripheral or
wedge-shaped perfusion abnormality.  No evidence for a ventilation-
perfusion mismatch.
IMPRESSION: Very low probability for pulmonary embolism.

## 2020-01-06 ENCOUNTER — Other Ambulatory Visit: Payer: Self-pay

## 2020-01-06 MED ORDER — HEPARIN SODIUM (PORCINE) 5000 UNIT/ML IJ SOLN
5000.00 | INTRAMUSCULAR | Status: DC
Start: 2020-01-06 — End: 2020-01-06

## 2020-01-06 MED ORDER — HYDROCODONE-ACETAMINOPHEN 5-325 MG PO TABS
1.00 | ORAL_TABLET | ORAL | Status: DC
Start: ? — End: 2020-01-06

## 2020-01-06 MED ORDER — DEXTROSE 10 % IV SOLN
125.00 | INTRAVENOUS | Status: DC
Start: ? — End: 2020-01-06

## 2020-01-06 MED ORDER — NYSTATIN 100000 UNIT/GM EX CREA
TOPICAL_CREAM | CUTANEOUS | Status: DC
Start: 2020-01-06 — End: 2020-01-06

## 2020-01-06 MED ORDER — ENALAPRIL MALEATE 5 MG PO TABS
5.00 | ORAL_TABLET | ORAL | Status: DC
Start: 2020-01-07 — End: 2020-01-06

## 2020-01-06 MED ORDER — CLOPIDOGREL BISULFATE 75 MG PO TABS
75.00 | ORAL_TABLET | ORAL | Status: DC
Start: 2020-01-07 — End: 2020-01-06

## 2020-01-06 MED ORDER — OXYBUTYNIN CHLORIDE 5 MG PO TABS
5.00 | ORAL_TABLET | ORAL | Status: DC
Start: 2020-01-06 — End: 2020-01-06

## 2020-01-06 MED ORDER — TIZANIDINE HCL 4 MG PO TABS
4.00 | ORAL_TABLET | ORAL | Status: DC
Start: ? — End: 2020-01-06

## 2020-01-06 MED ORDER — GLUCOSE 40 % PO GEL
15.00 | ORAL | Status: DC
Start: ? — End: 2020-01-06

## 2020-01-06 MED ORDER — LEVOTHYROXINE SODIUM 100 MCG PO TABS
200.00 | ORAL_TABLET | ORAL | Status: DC
Start: 2020-01-07 — End: 2020-01-06

## 2020-01-06 MED ORDER — GLIPIZIDE ER 5 MG PO TB24
5.00 | ORAL_TABLET | ORAL | Status: DC
Start: 2020-01-07 — End: 2020-01-06

## 2020-01-06 MED ORDER — GLUCAGON (RDNA) 1 MG IJ KIT
1.00 | PACK | INTRAMUSCULAR | Status: DC
Start: ? — End: 2020-01-06

## 2020-01-06 MED ORDER — INSULIN LISPRO 100 UNIT/ML ~~LOC~~ SOLN
1.00 | SUBCUTANEOUS | Status: DC
Start: 2020-01-07 — End: 2020-01-06

## 2020-01-06 MED ORDER — ALBUTEROL SULFATE HFA 108 (90 BASE) MCG/ACT IN AERS
2.00 | INHALATION_SPRAY | RESPIRATORY_TRACT | Status: DC
Start: 2020-01-06 — End: 2020-01-06

## 2020-01-06 MED ORDER — GABAPENTIN 300 MG PO CAPS
300.00 | ORAL_CAPSULE | ORAL | Status: DC
Start: 2020-01-06 — End: 2020-01-06

## 2020-01-06 MED ORDER — METOPROLOL TARTRATE 25 MG PO TABS
25.00 | ORAL_TABLET | ORAL | Status: DC
Start: 2020-01-06 — End: 2020-01-06

## 2020-01-06 MED ORDER — FLUTICASONE FUROATE 100 MCG/ACT IN AEPB
1.00 | INHALATION_SPRAY | RESPIRATORY_TRACT | Status: DC
Start: 2020-01-07 — End: 2020-01-06

## 2020-01-06 MED ORDER — ACETAMINOPHEN 325 MG PO TABS
650.00 | ORAL_TABLET | ORAL | Status: DC
Start: ? — End: 2020-01-06

## 2020-01-06 MED ORDER — GENERIC EXTERNAL MEDICATION
1.00 | Status: DC
Start: 2020-01-06 — End: 2020-01-06

## 2020-01-06 MED ORDER — DULOXETINE HCL 30 MG PO CPEP
30.00 | ORAL_CAPSULE | ORAL | Status: DC
Start: 2020-01-07 — End: 2020-01-06

## 2020-01-06 MED ORDER — ATORVASTATIN CALCIUM 40 MG PO TABS
40.00 | ORAL_TABLET | ORAL | Status: DC
Start: 2020-01-06 — End: 2020-01-06

## 2020-01-06 NOTE — Patient Outreach (Signed)
  [facility]) Care Management Chronic Special Needs Program    01/06/2020  Name: [name], DOB: 19-Sep-1961  MRN: [id number]   Ms. [name] is enrolled in a chronic special needs plan for Diabetes. Client admitted to [facility] center on 01/03/20. Individualized care plan sent to [facility] center utilization management department. Individualized care plan developed based upon HRA and available data.   PLAN: RNCM will continue to follow.   George Ina RN,BSN,CCM Chronic Care Management Coordinator [facility] Care Management [phone]

## 2020-01-10 ENCOUNTER — Other Ambulatory Visit: Payer: Self-pay

## 2020-01-10 ENCOUNTER — Ambulatory Visit: Payer: Self-pay

## 2020-01-10 NOTE — Patient Outreach (Signed)
Assigned General Discharge follow up Emmi call to [name] after discharging from [facility].

## 2020-01-10 NOTE — Patient Outreach (Signed)
  [facility]) Care Management Chronic Special Needs Program    01/10/2020  Name: [name], DOB: 1961/02/25  MRN: [id number]   Ms. [name] is enrolled in a chronic special needs plan for Diabetes. Client admitted to [facility] center on 01/03/20 due to a fall.  Client discharged on 01/09/20.  Reviewed and updated care plan.  Transition of care to be completed by [facility] discharge calls.   Goals Addressed            This Visit's Progress   . Decrease inpatient admissions/ readmissions with in the next year      . General - Client will not be readmitted within 30 days (C-SNP)       Client discharged on 01/09/19 Please follow discharge instructions and call provider if you have any questions. Please attend all follow up appointments as scheduled. Please take your medications as prescribed. Please call 24 Hour nurse advice line as needed ([phone]).       PLAN: RNCM will send updated individualized care plan to client and provider RNCM will continue to follow and collaborate/ care coordinate as needed,. Chronic care management coordinator will follow up in 1 month.   George Ina RN,BSN,CCM Chronic Care Management Coordinator [facility] Care Management [phone]

## 2020-01-24 ENCOUNTER — Other Ambulatory Visit: Payer: Self-pay

## 2020-01-25 ENCOUNTER — Other Ambulatory Visit: Payer: Self-pay

## 2020-01-25 NOTE — Patient Outreach (Signed)
  [facility]) Care Management Chronic Special Needs Program   01/25/2020  Name: [name], DOB: February 07, 1961  MRN: [id number]  The client was discussed in today's interdisciplinary care team meeting.  The following issues were discussed:  Client's needs, Changes in health status, Key risk triggers/risk stratification, Care Plan, Coordination of care and Issues/barriers to care  Participants present: Kathyrn Sheriff, MSN, RN, CCM   Melissa Sandlin RN,BSN,CCM, CDE  George Ina RN, BSN, CCM Cranford Mon, RN, CCM, CDE  Livia Snellen, RN, BSN, MS, CCM Iverson Alamin CBCS/CMAA  Plan:  Continue care coordination and collaboration as needed. RNCM will continue to follow as client's CSNP Care management coordinator.  George Ina RN,BSN,CCM Chronic Care Management Coordinator [facility] Care Management [phone]

## 2020-01-25 NOTE — Patient Outreach (Addendum)
[facility]) Care Management Chronic Special Needs Program  01/25/2020  Name: [name] DOB: Dec 22, 1960  MRN: [id number]  [name] is enrolled in a chronic special needs plan for Diabetes. Chronic Care Management Coordinator telephoned client to review health risk assessment and to develop individualized care plan.  Introduced the chronic care management program, importance of client participation, and taking their care plan to all provider appointments and inpatient facilities.    Subjective:Client states she had a nurse seeing her with home health. She states she was advised by her primary doctor to put her home health services on hold due to potential COVID exposure and being in quarantine. Client states she's had increase swelling in her hands and feet. She states her doctor put her on a fluid pill which has increased her urination. Client reports she is incontinent at times of urine and bowel and wears depends. Client states she continues to have swelling in her hands and feet.  RNCM  Advised client to contact her doctor to inform him of these ongoing symptoms. Client verbalized understanding and agreement. Client reports she is having difficulty getting into and out of her home due to her increased weakness and having concrete steps leading out of her front door. Client reports her husband has to manage her with a waist belt to help her maneuvers down the steps. Client states she is concerned for her husbands safety as he assists because of his health conditions. Client reports she uses a walker and wheelchair for ambulation. Client reports she fallen down the front steps of her home numerous times.  Client states she is unable to use the back door because the step is to steep and the entrance way into the house does not accommodate her wheelchair. Client states she really needs a ramp but is unable to afford. She reports she and her husband are renting the house they  live in. RNCM offered to refer client to [facility] Care management social worker to discuss possible resources to obtain ramp. Client verbalized agreement and appreciation. Client states she also needs another walker because the one she has is not accommodating her well. RNCM advised client to discuss this with the home health physical therapist when services resume. Physical therapist will be able to evaluate client for specific walker then request order from provider. Client verbalized understanding.   Client states she has been diabetic for approximately 10-12 years. She states she is unsure of her most recent A1c.  She reports checking her blood sugar approximately 2 times a day.She states her doctor wants her to check it at least 4 times per day. Client states sometimes she misses checking her blood sugar during the middle of the day. Client reports her blood sugars range from 135 to 370. She states she is aware she is not managing her diabetes as well as she could. She reports her doctor has referred her to an endocrinologist. She states she missed her initial appointment with the endocrinologist scheduled due to her possible COVID exposure. Client states she is awaiting call back from endocrinology office to reschedule. RNCM advised client to contact office if she does not hear from them within the  Next day.  Client reports she has a ovarian lesion and is being followed by her GYN.  She states she has to reschedule this appointment as well.  Client reports she has a follow up visit with her eye doctor on 02/03/20 and her next follow up with her primary  MD is 02/23/20. Client reports she had a phone visit with her primary MD after her recent admission in the hospital.  Client confirms she was in the hospital from 01/03/20 to 01/09/20 due to a fall and urinary tract infection. Client denies any broken bones. She states she has completed her medication for the urinary tract infection. Client reports she is taking  her  medication  but is unsure what all of the medications are for and if she takes them correctly.  RNCM reviewed and discussed medications with client and also offered to refer client to the [facility] pharmacist. Client verbally agreed to referral. Discussed with and informed client her most recent A1c was 9.3.  Client reports having recent foot exam by her podiatrist.  Client reports having depression symptoms due to her current medical status and not being able to do what she use to. RNCM offered to refer client to social worker for follow up of symptoms Client verbally agreed.    Goals Addressed            This Visit's Progress   .  Acknowledge receipt of Advanced Directive package       Discussed Advanced care planning with client Mailed Advanced care planning packet to client and referred client to [facility] Care management social worker for follow up.  RN case manager referred client to [facility] care management social worker to assist with advanced directive.     . Client understands the importance of follow-up with providers by attending scheduled visits       Client reports adherence to provider appointments.  Client reports having telephone visit with primary MD on 01/18/20 Client reports follow up appointments scheduled with doctors: Primary MD follow up 02/23/20 Gastroenterology follow up scheduled for 02/20/20 Ophthalmology follow up scheduled for 02/02/19    . Client verbalizes knowledge of Heart Attack self management skills within 3 months       RN case manager discussed heart attack signs/ symptoms with client Client advised to keep scheduled appointments with provider Advised to take her medications as prescribed.  Client knows when to call doctor and or 911 for symptoms.  Education article mailed to client: Heart attack    . Client will not report change from baseline and no repeated symptoms of stroke with in the next 3  months       RNCM discussed signs / symptoms of stroke.  Client aware  of when to access 911 for symptoms  RNCM mailed client education article to client: Signs of stroke     . Client will report no fall or injuries in the next 3 months.       RNCM discussed fall safety precautions. RNCM mailed client fall safety brochure    . Client will report no worsening of symptoms related to heart disease within the next 3 months       RNCM will send client education material regarding : Heart disease in Diabetics     . Client will verbalize a decrease in depression symptoms within 3 months       RN case manager referred client to a [facility] care management social worker for verbalized depression symptoms.     . client will verbalize communication with [facility] pharmacist within 3 months       RN Case manager referred client to [facility] pharmacist to assist with medication management    . Client will verbalize knowledge of chronic lung disease as evidenced by no ED visits or Inpatient stays related  to chronic lung disease        RNCM will send client education article: Asthma in Adults     . Client will verbalize knowledge of self management of Hypertension as evidences by BP reading of 140/90 or less; or as defined by provider       Education article mailed to client: High blood pressure: What you can do    . Client will verbalize obtaining information for ramp within 3 months       RN case manager referred client to social worker for assistance with ramp    . Client will verbalize obtaining new walker within 3 months       RN case manager advised client to follow up with her primary MD regarding reinstatement of home health services.  RN case manager advised client to discuss need for new walker with physical therapist once home health physical therapy services resumed.      . Client/Caregiver will verbalize understanding of instructions related to self-care and safety       Fall safety brochure mailed to client.  RN case manager referred client to [facility] care management social  worker for assistance with ramp RN mailed client education article on mild cognitive impairment.     Marland Kitchen HEMOGLOBIN A1C < 7.0       Reviewed Hgb A1c target and result of clients A1c - 9.3 Discussed with client strategies to improve blood sugar control:  Take medication as prescribed, carbohydrate controlled meal planning, being active, consistent blood sugar monitoring Advised client to reschedule appointment with her endocrinologist.     . Maintain timely refills of diabetic medication as prescribed within the year .       Take medications as prescribed Follow up with your doctor if you have questions regarding your medications Contact your assigned RN Case manager with [employer] if you are unable to afford your medications     . Obtain annual  Lipid Profile, LDL-C       Advised client to keep and/ or schedule annual physical with her primary care provider.     Illa Level Annual Eye (retinal)  Exam        Client reports she is scheduled to see her eye doctor on 02/03/20.  RN case manager encouraged client to keep follow up appointment with eye doctor. Discussed importance of having a diabetic eye exam     . Obtain Annual Foot Exam       Discussed with client importance of having annual foot exam.  Previous foot exam noted per MEDICAL RECORD NUMBER9/30/20    . Obtain annual screen for micro albuminuria (urine) , nephropathy (kidney problems)       Discussed with client importance of yearly physicals and follow up with primary care and/ or endocrinologist.    . Obtain Hemoglobin A1C at least 2 times per year       Per medical record Hgb A1c  completed: 01/13/19 and 01/07/20 Discussed with client importance of consistent follow up with provider for exams and lab work    . Visit Primary Care Provider or Endocrinologist at least 2 times per year        Advised client to call and schedule yearly physical with primary MD and appointment with endocrinologist.  Most recent primary MD  appointment was 01/18/20 virtual visit.       Assessment: Client is not meeting diabetes self-management goal of hemoglobin A1C of <7.0 with most recent reading of 9.3%  without  reports of hypoglycemia.  Client needs ongoing education/ management of her diabetes. Client advised to reschedule appointment with endocrinologist.  Client has good understanding of:  COVID-19 cause, symptoms, precautions (social distancing, stay at home order, hand washing, wear face covering when unable to maintain or ensure 6 foot social distancing), and symptoms requiring provider notification.  Plan:  Send successful outreach letter with a copy of their individualized care plan, Send individual care plan to provider and Send educational material  Chronic care management coordination will outreach in:  3 Months  Will refer client to:  Social Work and Pharmacy   George Ina RN,BSN,CCM Chronic Care Management Coordinator [facility] Care Management [phone]

## 2020-01-26 ENCOUNTER — Ambulatory Visit: Payer: Self-pay

## 2020-01-26 ENCOUNTER — Other Ambulatory Visit: Payer: Self-pay | Admitting: Pharmacist

## 2020-01-26 NOTE — Patient Outreach (Signed)
[facility]) [place]   01/26/2020  [name] January 22, 1961 [id number]  Reason for referral: Medication Review  Referral source: [employer] Care Manager with [facility] Current insurance: [employer]  PMHx includes but not limited to:  Hypothyroidism, T2DM, HTN / HLD, COPD, arthritis, asthma, hx TIA, CAD  Outreach:  Successful telephone call with .  HIPAA identifiers verified.   Subjective:  Patient is agreeable to review medications.  She is not currently using a pillbox but reports she is very confused with dosing.  She reports her providers have recently told her she needs to start taking medications at specific times of day and she is motivated to take medications correctly. She reports her spouse also helps her manage her medications.  She sees providers at [facility] and medications in [facility] record are not up to date.   Objective: The 10-year ASCVD risk score Mikey Bussing DC Brooke Bonito., et al., 2013) is: 8%   Values used to calculate the score:     Age: 59 years     Sex: Female     Is Non-Hispanic African American: No     Diabetic: Yes     Tobacco smoker: No     Systolic Blood Pressure: 086 mmHg     Is BP treated: Yes     HDL Cholesterol: 32 MG/DL     Total Cholesterol: 154 MG/DL  Lab Results  Component Value Date   CREATININE 0.57 04/21/2011   CREATININE 0.49 04/20/2011   CREATININE 0.58 04/19/2011    Lab Results  Component Value Date   HGBA1C (H) 04/16/2011    9.0 (NOTE)                                                                       According to the ADA Clinical Practice Recommendations for 2011, when HbA1c is used as a screening test:   >=6.5%   Diagnostic of Diabetes Mellitus           (if abnormal result  is confirmed)  5.7-6.4%   Increased risk of developing Diabetes Mellitus  References:Diagnosis and Classification of Diabetes Mellitus,Diabetes VHQI,6962,95(MWUXL 1):S62-S69 and Standards of Medical Care  in         Diabetes - 2011,Diabetes Care,2011,34  (Suppl 1):S11-S61.    Lipid Panel     Component Value Date/Time   CHOL 227 (H) 04/16/2011 2232   TRIG 436 (H) 04/16/2011 2232   HDL 28 (L) 04/16/2011 2232   CHOLHDL 8.1 04/16/2011 2232   VLDL UNABLE TO CALCULATE IF TRIGLYCERIDE OVER 400 mg/dL 04/16/2011 2232   LDLCALC  04/16/2011 2232    UNABLE TO CALCULATE IF TRIGLYCERIDE OVER 400 mg/dL        Total Cholesterol/HDL:CHD Risk Coronary Heart Disease Risk Table                     Men   Women  1/2 Average Risk   3.4   3.3  Average Risk       5.0   4.4  2 X Average Risk   9.6   7.1  3 X Average Risk  23.4   11.0        Use the  calculated Patient Ratio above and the CHD Risk Table to determine the patient's CHD Risk.        ATP III CLASSIFICATION (LDL):  <100     mg/dL   Optimal  824-235  mg/dL   Near or Above                    Optimal  130-159  mg/dL   Borderline  361-443  mg/dL   High  >154     mg/dL   Very High    BP Readings from Last 3 Encounters:  No data found for BP    Allergies  Allergen Reactions  . Neomycin     Skin reaction/ hives/ skin shedding    Medications Reviewed Today   Medications were not reviewed prior to this encounter     Assessment: Drugs sorted by system:  Neurologic/Psychologic: duloxetine  Hematologic: clopidogrel, aspirin 81mg   Cardiovascular: enalapril, lovastatin, metoprolol, omega-3 fish oil  Pulmonary/Allergy: albuterol inhaler, benzonatate capsules, fluticasone inhaler  Gastrointestinal: ondanestron  Endocrine: levothyroxine, metformin, semaglutide  Topical:nystatin cream   Pain:acetaminophen  Genitourinary: solifenacin  Vitamins/Minerals/Supplements: vitamin C, cyanocobalamin, vitamin D  Medication Review Findings:  . Taking clopidogrel BID for several months due to confusion - counseled to reduce to once daily.  Patient voiced understanding and understands risk of increased bleeding.   Has not been taking  Vesicare due to confusion - counseled patient to contact MD for recommendations on if she should resume.   Marland Kitchen Poor medication adherence: Recommended patient start using pillbox to improve compliance and reduce dosing errors.  Patient's spouse will buy pillbox this week from the store and assist her with filling pillbox.     Medication Assistance Findings:  No medication assistance needs identified  Plan:  Will update provider regarding medication issues above . Will close [facility] pharmacy case as no further medication needs identified at this time.  Am happy to assist in the future as needed.     [facility], PharmD, [facility] Clinical Pharmacist [facility] [phone]

## 2020-02-23 ENCOUNTER — Other Ambulatory Visit: Payer: Self-pay

## 2020-02-23 NOTE — Patient Outreach (Signed)
  [facility]) Care Management Chronic Special Needs Program    02/23/2020  Name: [name], DOB: 11/04/1961  MRN: [id number]   [name] is enrolled in a chronic special needs plan for Diabetes. Returned call to client. . Unable to reach. HIPAA compliant voice message left with call back phone number and return call request.   PLAN; RNCM will attempt 2nd  telephone call to client within 1 week    .  George Ina RN,BSN,CCM Chronic Care Management Coordinator [facility] Care Management [phone]

## 2020-03-01 ENCOUNTER — Ambulatory Visit: Payer: Self-pay

## 2020-03-06 ENCOUNTER — Ambulatory Visit: Payer: Self-pay

## 2020-04-11 ENCOUNTER — Ambulatory Visit: Payer: Self-pay

## 2020-04-16 ENCOUNTER — Telehealth: Payer: Self-pay | Admitting: *Deleted

## 2020-04-16 NOTE — Patient Outreach (Signed)
[facility]) Care Management  04/16/2020  [name] Lonzo 06-09-1961 [id number]   CSW received referral from [facility] RNCM, Davina for depression symptoms. PHQ2 - 5, PHQ9 - 22, Advanced directive assistance (RNCM mailed Advanced Directive packet) and possible assistance with ramp for home. Client and her husband are renting their home & have been there 10 years. Client uses a walker and wheelchair for ambulation. She is having increased difficulty getting into and out of her home due to her health condition. Per client, the front of her home has 6 concrete steps that creates difficulty for her. She reports her husband has to use a waist belt to hold her to get down the steps. She is concerned for her husband's safety when he assists her due to his health conditions. Client states she is unable to get up and down the back step of the home because it is to steep and narrow for her wheelchair.   Patient would not qualify for [employer] as they must own their home for that program, but CSW did speak with patient about [facility] People with Disabilities ([address] [facility] [address] ph#: [phone]) & [facility] (ph#: [phone]). Patient took down their phone numbers and plans to reach out to them both.   Patient's husband spoke with [address] as he noticed a [address] [employer] in the driveway of one of their neighbors and they were installing a ramp for them, so he is waiting to hear back from them as well.   CSW spoke with patient about depression screening, patient states that she is starting to feel better but is anxious to get out of the house (safely). Patient has an appointment/wellness check this Thursday with her PCP, Dr. Ludwig Clarks that she is going to try to get to.   CSW will follow-up with patient in 1 week to ensure that she has had a chance to call & speak with someone from Independent [facility] & also [facility].    Lincoln Maxin, LCSW [facility]  Clinical Social Worker cell #: [phone]

## 2020-04-17 ENCOUNTER — Other Ambulatory Visit: Payer: Self-pay

## 2020-04-17 NOTE — Patient Outreach (Addendum)
[facility]) Care Management Chronic Special Needs Program  04/17/2020  Name: [name] DOB: 06-Mar-1961  MRN: [id number]  Ms. [name] is enrolled in a chronic special needs plan for Diabetes. Client reports she is doing a better. Client verbalized appreciation for referrals to the [facility] care management pharmacist and social worker. Client states the pharmacist review her medications. She reports she understands her medications much better. She reports she is using a pill box for her medications which has been very helpful. Client states she contacted the numbers given to her by the social worker regarding the ramp. She states she is waiting for a return call regarding ramp assistance.  Client states her blood sugars are better ranging  from 79 to 170's fasting. She states she contributes this to being on Ozempic. She states she is also eating earlier and eating more fruits and vegetables.  Client reports she received a new blood sugar meter. She reports next follow up visit with her primary [facility] is 04/18/20.  Client reports recent fall at home. She states she fell into her chair. She denies any injury.  Client states sense then her husband moved out some of the furniture and removed the small rugs. She reports having more space to maneuver with her wheelchair. Client reports she and her husband ordered Comptroller. She states she is excited about receiving them.   Reviewed and updated care plan.    Goals Addressed            This Visit's Progress   . COMPLETED:  Acknowledge receipt of Advanced Directive package       Client reports Advanced directive received.     . Client understands the importance of follow-up with providers by attending scheduled visits   On track    Client reports adherence to provider appointments.  Primary MD visit on 01/18/20 Endocrinologist visit 01/13/20 Continue to maintain and keep follow up visits with your providers    . Client verbalizes  knowledge of Heart Attack self management skills within 3 months   On track    Continue to take your medications as prescribed.  Follow up with your doctor as recommended.  RNCM will send client education article: Lowering your risk of heart disease.     . Client will not report change from baseline and no repeated symptoms of stroke with in the next 3  months   On track    Follow up with your doctor as recommended.  Take your medication as prescribed.  RNCM will send client education article: What you can do to prevent a second stroke     . Client will report no fall or injuries in the next 3 months.   Not on track    Client reports in home fall with no injury Fall safety re-discussed with client     . Client will report no worsening of symptoms related to heart disease within the next 3 months   On track    Client reports no symptoms related to heart disease.  Continue to take your medication as prescribed.  Follow up with your doctor as recommended.      . Client will verbalize a decrease in depression symptoms within 3 months   On track    Client reports receiving follow up with social worker Client reports decrease in depression symptoms.  RNCM case manager will send client education article: Depression:  other things you can do.     . COMPLETED: client  will verbalize communication with [facility] pharmacist within 3 months       Client verbalized communication with [facility] pharmacist on 01/26/20.  Goal met    . Client will verbalize knowledge of chronic lung disease as evidenced by no ED visits or Inpatient stays related to chronic lung disease    On track    Per chart review and client update, no ED visits noted.  RNCM will send client education article:  Exacerbation of COPD       . Client will verbalize knowledge of self management of Hypertension as evidences by BP reading of 140/90 or less; or as defined by provider   On track    Assessed high blood pressure self management  skills.  Client reports purchasing home blood pressure monitor Take your medications as prescribed.  Contact your doctor if you have questions.  Continue to monitor your blood pressure and take results to your doctor appointments.     . Client will verbalize obtaining information for ramp within 3 months   On track    Client reports contacting resources for ramp as advised by Education officer, museum.  Contact social worker if your have further questions/ concerns.     . COMPLETED: Client will verbalize obtaining new walker within 3 months       Client reports having walker.     . Client/Caregiver will verbalize understanding of instructions related to self-care and safety   On track    Client report furniture and small rugs removed from living space.  Client reports she has purchased lift chair.  Refer to fall safety brochure as needed.     . COMPLETED: General - Client will not be readmitted within 30 days (C-SNP)       Client reports she has not been readmitted to the hospital     . HEMOGLOBIN A1C < 7.0       Reviewed Hgb A1c target and result of clients A1c - 9.3 Re-Discussed diabetes self management actions:  Glucose monitoring per provider recommendation  Visit provider every 3-6 months as directed  Hbg A1C level every 3-6 months.  Carbohydrate controlled meal planning  Taking diabetes medication as prescribed by provider  Physical activity      . Maintain timely refills of diabetic medication as prescribed within the year .   On track    Review of electronic medical record medication dispense report indicates client maintains timely refills of diabetic medications  Continue to take your medications as prescribe.d      . Obtain annual  Lipid Profile, LDL-C   On track     The goal for LDL is less than 70 mg/ dl as you are at high risk for complications try to avoid saturated fats, trans-fats, and eat more fiber.     Illa Level Annual Eye (retinal)  Exam    On track    Client  reports she is scheduled to see her eye doctor on 02/03/20.   Plan to schedule your eye exam yearly    . Obtain Annual Foot Exam   On track    Previous foot exam noted per MEDICAL RECORD NUMBER9/30/20 Diabetes foot care - Check feet daily at home (look for skin color changes, cuts, sores or cracks in the skin, swelling of feet or ankles, ingrown or fungal toenails, corn or calluses). Report these findings to your doctor - Wash feet with soap and water, dry feet well especially between toes - Moisturize your feet but not between the  toes - Always wear shoes that protect your whole feet.      . Obtain annual screen for micro albuminuria (urine) , nephropathy (kidney problems)   On track    Attend yearly physicals and follow up visits with your providers and complete labs as recommended Discuss with doctor annual urine screening for micro albuminuria at next follow up appointment.     Illa Level Hemoglobin A1C at least 2 times per year   On track    Per medical record Hgb A1c  completed: 01/13/19 and 01/07/20 Continue to keep your follow up appointments with your provider and have lab work completed as recommended    . Visit Primary Care Provider or Endocrinologist at least 2 times per year    On track    Most recent primary MD appointment was 01/18/20 virtual visit.  schedule a yearly physical and follow up visit as recommended by your providers      Late entry:  Updated individualized care plan sent to primary care provider and client.   Plan:  Send successful outreach letter with a copy of their individualized care plan, Send individual care plan to provider and Send educational material  Chronic care management coordinator will outreach in:  3 Months   Quinn Plowman RN,BSN,CCM [facility] [phone]    .

## 2020-04-23 ENCOUNTER — Ambulatory Visit: Payer: Self-pay | Admitting: *Deleted

## 2020-04-24 ENCOUNTER — Ambulatory Visit: Payer: HMO

## 2020-05-02 ENCOUNTER — Other Ambulatory Visit: Payer: Self-pay

## 2020-05-02 ENCOUNTER — Other Ambulatory Visit: Payer: Self-pay | Admitting: *Deleted

## 2020-05-02 NOTE — Patient Outreach (Signed)
[facility]) Care Management  05/02/2020  Pammie Chirino Tetrault 26-May-1961 [id number]   CSW made contact with pt today by phone and confirmed pt identity.  CSW introduced self, role and reason for call- mobility issues and needs. Pt reports she did contact the agency list provided by other CSW.  Per pt, "they want [place] to pay for the lumber and then they will build it".  Another agency mailed pt some paperwork to complete which arrived today per pt.  CSW encouraged pt to complete the paperwork and return to them for determination.  Pt reports she and her husband are renting but are communicating with landlord/owner for possible purchase in the next year.  "we are hoping he might let some of the back rent go towards the final purchase price".  Pt is unsure how quickly that may happen; or if and is encouraged by CSW to consider other options that may be more accessible.  Pt reports she has not been out of the house since January because of the concrete steps and her legs are so weak "they give out and I fall".    CSW will investigate additional resource options and follow up with pt in the next 2 weeks.   Reece Levy, MSW, LCSW Clinical Social Worker  [facility] [employer] [employer] [phone]

## 2020-05-02 NOTE — Patient Outreach (Signed)
  [facility]) Care Management Chronic Special Needs Program    05/02/2020  Name: [name], DOB: 11/07/1961  MRN: [id number]   Ms. Hallee Clouatre is enrolled in a chronic special needs plan for Diabetes. Telephone call to client for follow up. Unable to reach. HIPAA compliant voice message left with call back phone number and return call request.  PLAN:  RNCM will attempt 2nd telephone outreach to client within 1 week.   George Ina RN,BSN,CCM Chronic Care Management Coordinator [facility] Care Management [phone]

## 2020-05-03 ENCOUNTER — Ambulatory Visit: Payer: Self-pay | Admitting: *Deleted

## 2020-05-15 ENCOUNTER — Other Ambulatory Visit: Payer: Self-pay | Admitting: *Deleted

## 2020-05-15 ENCOUNTER — Encounter: Payer: Self-pay | Admitting: *Deleted

## 2020-05-15 NOTE — Patient Outreach (Addendum)
[facility]) Care Management  05/15/2020  Arnett Galindez Heupel 1961/05/21 [id number]   CSW was able to speak with pt who reports they have misplaced the paperwork originally sent.  CSW provided the # for them to call the [facility] for help with ramp.  "We have both been sick".  Pt reassured to call and see if they can assist not only with the cost of labor but also with the materials cost.  CSW also called [employer] who indicated they are able to seek funding through other agencies for the material sometimes so not to let that stop them from calling to apply. CSW will touch base again in the  next 2 weeks for updates on this progress.  Reece Levy, MSW, LCSW Clinical Social Worker  [facility] [employer] [phone]

## 2020-05-29 ENCOUNTER — Other Ambulatory Visit: Payer: Self-pay | Admitting: *Deleted

## 2020-05-29 NOTE — Patient Outreach (Signed)
[facility]) Care Management  05/29/2020  [name] 1961-10-04 [id number]   CSW made contact with pt who reports she has received, completed and place paperwork in mail today for possible ramp (build) assistance.  Pt is a bit preoccupied with the fact that someone stole their dog out of the front yard. Pt reports they are searching, posting alerts, etc in hopes of finding their 60yo dog, "Abbott".  CSW offered emotional support and hope.  CSW will plan to follow up with pt and resources regarding applications.   Reece Levy, MSW, LCSW Clinical Social Worker  [facility] [employer] [phone]

## 2020-06-14 ENCOUNTER — Ambulatory Visit: Payer: Self-pay | Admitting: *Deleted

## 2020-06-15 ENCOUNTER — Other Ambulatory Visit: Payer: Self-pay | Admitting: *Deleted

## 2020-06-15 NOTE — Patient Outreach (Signed)
[facility]) Care Management  06/15/2020  Ellanor Feuerstein Nuss 08/03/1961 [id number]   CSW was unable to reach pt by phone on 06/14/2020.  CSW will attempt outreach again in 3-4 business days per protocol.   Reece Levy, MSW, LCSW Clinical Social Worker  [facility] [employer] [phone]

## 2020-06-21 ENCOUNTER — Ambulatory Visit: Payer: Self-pay | Admitting: *Deleted

## 2020-06-22 ENCOUNTER — Other Ambulatory Visit: Payer: Self-pay | Admitting: *Deleted

## 2020-06-22 NOTE — Patient Outreach (Signed)
[facility]) Care Management  06/22/2020  Shealyn Sean Mcclaran 06/18/1961 [id number]   CSW was unable to reach pt on 06/21/2020 and no voicemail.  CSW will mail pt an Unsuccessful Outreach letter and plan a 3rd outreach attempt per policy; 3 call attempts in 10 business days.    Reece Levy, MSW, LCSW Clinical Social Worker  [facility] [employer] [phone]'

## 2020-06-26 ENCOUNTER — Other Ambulatory Visit: Payer: Self-pay | Admitting: *Deleted

## 2020-06-26 NOTE — Patient Outreach (Signed)
[facility]) Care Management  06/26/2020  Daizha Anand Losada 1961/09/24 [id number]   CSW spoke with pt by phone who shared they have not located their dog yet- "it's been 5 weeks".   Pt is coping ok however missing her companion, "Abbott".  Pt also shared that they have completed the application for ramp assistance and await word.  Pt is in need of alternatives for getting out of her home and transported to md appointments; including one coming up in August. CSW has offered to make a referral to our Transportation support for assistance.  They are still hoping to propose to their landlord a rent to own option and then would be interested in permanent adaptations being made.  CSW will plan to check on ramp assistance application I the next 2 weeks.  Reece Levy, MSW, LCSW Clinical Social Worker  [facility] [employer] [phone]

## 2020-07-10 ENCOUNTER — Ambulatory Visit: Payer: Self-pay

## 2020-07-11 ENCOUNTER — Ambulatory Visit: Payer: Self-pay

## 2020-07-16 ENCOUNTER — Other Ambulatory Visit: Payer: Self-pay | Admitting: *Deleted

## 2020-07-16 NOTE — Patient Outreach (Signed)
[facility]) Care Management  07/16/2020  [name] Jeane 1961-04-17 [id number]   CSW spoke with pt today by phone. She reports the ramp building/assistance "has come to a stand still".  She uncertain why and confirms application was completed and mailed to the [facility] association .  CSW will investigate updates as well as mail pt info on other possible resources as well as info on Remote Health which pt may be interested in given the struggle to get out of her home at this time to any appointments.  Pt does share she has signed up for a "video health" visit with PCP.   Pt also shared that she has over $9,000 in outstanding medical bills.  CSW will mail pt info on Medicaid (she may be eligible with these bills/debt for partial or full Medicaid after the "patient deductible" is considered. Pt appreciative of all assistance and is reminded to call if needs arise prior to CSW next call.   Reece Levy, MSW, LCSW Clinical Social Worker  [facility] [employer] [phone]

## 2020-07-26 ENCOUNTER — Other Ambulatory Visit: Payer: Self-pay

## 2020-07-26 NOTE — Patient Outreach (Signed)
  [facility]) Care Management Chronic Special Needs Program    07/26/2020  Name: [name], DOB: [birth year]  MRN: [id number]   [name] is enrolled in a chronic special needs plan for Diabetes. Telephone call to client for CSNP assessment follow up. Unable to reach. HIPAA compliant voice message left with call back phone number and return call request.   PLAN; RNCM will attempt 2nd telephone outreach to client in 1 week.   George Ina RN,BSN,CCM Chronic Care Management Coordinator [facility] Care Management [phone]

## 2020-08-01 ENCOUNTER — Other Ambulatory Visit: Payer: Self-pay | Admitting: *Deleted

## 2020-08-01 NOTE — Patient Outreach (Signed)
[facility]) Care Management  08/01/2020  [name] [name], 1962 [id number]   Pt wants to get the house cleaned out and do some other things before considering the Remote Health option. They are still awaiting word on the ramp assistance- seems to be delayed because of COVID, cost of lumber, etc.  "If we get another stimulus check my husband is [place] build one for [place]".  Pt is wanting to reach out to CSW "In a month or so".  CSW advised pt of plans to sign off and await a call from then when/if  they are interested. CSW will mail any ramp assistance resources identified and sign off. CSW will advise PCP and [facility] team.   Reece Levy, MSW, LCSW Clinical Social Worker  [facility] [employer] [phone]

## 2020-08-02 ENCOUNTER — Other Ambulatory Visit: Payer: Self-pay

## 2020-08-02 DIAGNOSIS — E119 Type 2 diabetes mellitus without complications: Secondary | ICD-10-CM | POA: Insufficient documentation

## 2020-08-02 DIAGNOSIS — J441 Chronic obstructive pulmonary disease with (acute) exacerbation: Secondary | ICD-10-CM | POA: Insufficient documentation

## 2020-08-02 DIAGNOSIS — I10 Essential (primary) hypertension: Secondary | ICD-10-CM

## 2020-08-02 NOTE — Patient Outreach (Signed)
[facility]) Care Management Chronic Special Needs Program  08/02/2020  Name: [name] DOB: 1961-07-02  MRN: [id number]  Ms. [name] is enrolled in a chronic special needs plan for DiabetesTelephone call to client regarding CSNP assessment follow up. HIPAA verified. Client states she is doing well. She reports she is checking her blood pressures routinely as well as her temperature, oxygen level, along with blood sugars as her doctor advised.  Client reports her fasting blood sugar is 80 today. Client states she has noticed an improvement in her blood sugars since she started taking Ozempic. Client reports she is losing weight. She states she has lost 100 lbs in a year.  Client states with the help of her husband she has changed her eating habits to a more healthier diet, is watching her food portions, and drinks sparkling water instead of sodas. She states her goal is to lose 80 more pounds. Client states she is still not able to get to her doctors appointments because of her home entry stair being unsafe for her. She states she and her husband have called several agencies regarding a ramp but they are not providing the ramps at this time due to COVID. Client states her husband is still looking into having someone build a ramp.     Goals Addressed              This Visit's Progress     Client reports she would like to lose 80 more pounds. (pt-stated)   On track     Client reports losing 100 lbs in 1 year.  RN case manager will refer client to Health coach Review [employer] exercise booklet sent to you in the mail for exercises. Consult with your doctor prior to starting exercise program.  Plan to eat low carbohydrate and low salt meals, Watch portion sizes and avoid sugar sweetened drinks.         Client understands the importance of follow-up with providers by attending scheduled visits   Not on track     Primary MD visit on 01/18/20 Endocrinologist  visit 01/13/20 RN case manager re-discussed remote health program with client Per chart review social worker mailed remote health program information to client.       Client verbalizes knowledge of Heart Attack self management skills within 3 months   On track     Continue to take your medications as prescribed.  Follow up with your doctor as recommended by office visit or tele health.  RNCM will send client education article: Heart Attack      Client will not report change from baseline and no repeated symptoms of stroke with in the next 3  months   On track     Continue to follow up with your doctor as recommended by office or telehealth visit.  Continue to take your medication as prescribed.  RNCM will send client education article: Stroke       COMPLETED: Client will report no fall or injuries in the next 3 months.   On track     Client reports no falls within the past 3 months.        COMPLETED: Client will report no worsening of symptoms related to heart disease within the next 3 months   On track     Client reports no symptoms related to heart disease.  .        Client will report ongoing home vital sign monitoring in 3 months.  Continue to monitor blood pressure, oxygen level, temperature, and blood sugars as recommended by your doctor Record your results in your [employer] calendar and report to provider as indicated. Call primary care provider for abnormal vital sign results.       COMPLETED: Client will verbalize a decrease in depression symptoms within 3 months   On track     Client reports receiving follow up with social worker Client reports decrease in depression symptoms.  Client reports she continues to take her antidepressant medication as prescribed.        Client will verbalize knowledge of chronic lung disease as evidenced by no ED visits or Inpatient stays related to chronic lung disease    On track     Per chart review and client  update, no ED or inpatient hospitalizations related to COPD.  RNCM will send client education article:  COPD exacerbation, Adult ED.  Continue to take you medication as recommended Follow up with your doctor as recommended.          Client will verbalize knowledge of self management of Hypertension as evidences by BP reading of 140/90 or less; or as defined by provider   On track     Client report home blood pressure reading of: 118/75,  Continue to check your blood pressure regularly.  Do not skip doses of blood pressure medicine.  The medicine does not work as well if you skip doses. Skipping doses also puts you at risk for problems.       COMPLETED: Client will verbalize obtaining information for ramp within 3 months   On track     Client reports obtaining information for ramp.        Client/Caregiver will verbalize understanding of instructions related to self-care and safety   On track     Client reports she has purchased lift chair. Review to fall safety brochure as needed.   Make sure there is good lighting throughout your home  Make sure walkways are clear of clutter, cords and throw rugs  Make sure furniture is secure, sturdy and does not swivel  Grab bars are suggested near the toilet, tub and shower  Use an assistive device ( cane/ walker) if advised by your doctor.   Turn on the lights when you go into a dark area.  Use night- lights  Keep items that you use often in easy to reach places.       HEMOGLOBIN A1C < 7        Reviewed Hgb A1c target and result of clients A1c - 9.3 Continue to follow diabetes self management actions:  Glucose monitoring per provider recommendation  Visit provider every 3-6 months as directed  Hbg A1C level every 3-6 months.  Carbohydrate controlled meal planning  Taking diabetes medication as prescribed by provider  Physical activity        COMPLETED: Maintain timely refills of diabetic medication as prescribed within the  year .   On track     Client reports  she is able to afford her medications and maintains timely refills of her diabetic medications.        Obtain annual  Lipid Profile, LDL-C   Not on track     No data available   The goal for LDL is less than 70mg /dl as you are at high risk for complications.  Try to avoid saturated fats, trans-fats and eat more fiber.  Continue to take statin (cholesterol) medicine as ordered.  Obtain Annual Eye (retinal)  Exam    Not on track     Client reports she was unable to keep her February 2021 eye appointment.  Plan to schedule your eye exam yearly Diabetes can affect your vision. Plan to have a dilated eye exam every year      Obtain Annual Foot Exam   On track     Previous foot exam noted per MEDICAL RECORD NUMBER9/30/20 Continue to adhere to Diabetes foot care - Check feet daily at home (look for skin color changes, cuts, sores or cracks in the skin, swelling of feet or ankles, ingrown or fungal toenails, corn or calluses). Report these findings to your doctor - Wash feet with soap and water, dry feet well especially between toes - Moisturize your feet but not between the toes - Always wear shoes that protect your whole feet.        Obtain annual screen for micro albuminuria (urine) , nephropathy (kidney problems)   Not on track     No data available Attend yearly physicals and follow up visits with your providers and complete labs as recommended Discuss with doctor annual urine screening for micro albuminuria at next follow up appointment.       Obtain Hemoglobin A1C at least 2 times per year   Not on track     Per medical record Hgb A1c  completed: 01/13/19 and 01/07/20 RN case manager re-discussed remote health option with client Social worker mailed client remote health information.       Plan:  Send successful outreach letter with a copy of their individualized care plan, Send individual care plan to provider and Send educational  material  Chronic care management coordinator will outreach in:  3 Months  RNCM will refer client to health coach   George Ina RN,BSN,CCM Chronic Care Management Coordinator [facility] Care Management [phone]    .

## 2020-10-19 ENCOUNTER — Other Ambulatory Visit: Payer: Self-pay

## 2020-10-20 NOTE — Patient Outreach (Addendum)
[facility]) Care Management Chronic Special Needs Program  10/20/2020  Name: [name] DOB: 09/20/1961  MRN: [id number]  Ms. [name] is enrolled in a chronic special needs plan for Diabetes. Telephone call to client for CSNP assessment follow up and Health risk assessment completion.  HIPAA verified.  Client reports her previous primary care provider has left the practice. Client states her next tele-health follow up visit with her new primary care provider,  [name], [place] is scheduled for 11/22/20.  Client states she and her husband have talked with several resources regarding ramp installation. She reports they are still unable to have ramp installed. RNCM advised client to contact social worker Reece Levy to re-discuss resources. Contact phone number for social worker provider to client. Client reports 2 falls within the past 3 months without severe injury.    Goals Addressed              This Visit's Progress   .  COMPLETED: Client reports she would like to lose 80 more pounds. (pt-stated)        Client reports losing 100 lbs in 1 year.        .  COMPLETED: Client understands the importance of follow-up with providers by attending scheduled visits   On track     Primary MD visit on 01/18/20, 08/09/20 ( telehealth visit)and 10/03/20( Telehealth visit) Endocrinologist visit 01/13/20     .  COMPLETED: Client verbalizes knowledge of Heart Attack self management skills within 3 months        Client voices understanding of heart attack self management by: Stating signs and symptoms of heart attack Client reports taking her medications as prescribed.  Client reports keeping follow up telehealth visits as recommended by her doctor.  Client states she is aware to call 911 for heart attack symptoms. C    .  COMPLETED: Client will not report change from baseline and no repeated symptoms of stroke with in the next 3  months        Client denies any stroke  symptoms.       .  COMPLETED: Client will report ongoing home vital sign monitoring in 3 months.        Client reports monitoring her blood sugars at least 2-3 times per day. Client reports monitoring blood pressure at least 1 time per month.   RN case manager encouraged client to monitor blood pressure at least 1 time per week.    .  COMPLETED: Client will verbalize knowledge of chronic lung disease as evidenced by no ED visits or Inpatient stays related to chronic lung disease         Client denies hospitalization and/ or Emergency department visit due to lung condition.         .  COMPLETED: Client will verbalize knowledge of self management of Hypertension as evidences by BP reading of 140/90 or less; or as defined by provider        Client report home blood pressure reading of: 110/70 and 108/60 Client reports monitoring blood pressure at home at least 1 time per month.  RN case manager encouraged client to monitor blood pressure 1 time per week.      .  Client/Caregiver will verbalize understanding of instructions related to self-care and safety   Not on track     Client reports having 2 falls during transfers within the past 3 months.  RN case manager advised client to discuss need for home  physical therapy evaluation.  Continue to use ambulating devices as recommended.  RN case manager Reviewed below fall safety recommendations.  Make sure there is good lighting throughout your home . Make sure walkways are clear of clutter, cords and throw rugs . Make sure furniture is secure, sturdy and does not swivel . Grab bars are suggested near the toilet, tub and shower . Use an assistive device ( cane/ walker) if advised by your doctor.  . Turn on the lights when you go into a dark area.  Use night- lights . Keep items that you use often in easy to reach places.     Marland Kitchen  HEMOGLOBIN A1C < 7        Reviewed Hgb A1c target and result of clients A1c - 9.3 Discussed diabetes self  management actions:  Glucose monitoring per provider recommendation  Visit provider every 3-6 months as directed  Hbg A1C level every 3-6 months.  Carbohydrate controlled meal planning  Taking diabetes medication as prescribed by provider  Physical activity      .  Obtain annual  Lipid Profile, LDL-C   On track     No data available  Continue to follow below recommendations.  . The goal for LDL is less than 70mg /dl as you are at high risk for complications. . Try to avoid saturated fats, trans-fats and eat more fiber. . Continue to take statin (cholesterol) medicine as ordered.    .  Obtain Annual Eye (retinal)  Exam    Not on track     Client reports last eye exam winter 2020 Client reports she will schedule eye exam once she is able to get out of her home ( needs ramp to get out of home safely) RN case manager advised client to contact social worker 11-06-1990 to follow up regarding ramp. RN case Reece Levy provided client phone number to Production designer, theatre/television/film.      .  Obtain Annual Foot Exam   Not on track     Previous foot exam noted per MEDICAL RECORD NUMBER9/30/20 RN case manager discussed importance of yearly foot exam by provider.  Continue to adhere to Diabetes foot care - Check feet daily at home (look for skin color changes, cuts, sores or cracks in the skin, swelling of feet or ankles, ingrown or fungal toenails, corn or calluses). Report these findings to your doctor - Wash feet with soap and water, dry feet well especially between toes - Moisturize your feet but not between the toes - Always wear shoes that protect your whole feet.      .  Obtain annual screen for micro albuminuria (urine) , nephropathy (kidney problems)   Not on track     No data available Client reports she is unable to attend primary care provider office for physical at this time. Client reports having tele health visits with primary provider.      09/16/19 Hemoglobin A1C at least 2 times per year    Not on track     Per medical record Hgb A1c  completed: 01/13/19 and 01/07/20 Client reports currently having tele health visits with primary care provider.  Client aware remote health has been offered and assistance provided by 01/09/20 for ramp assistance resource.     .  COMPLETED: Visit Primary Care Provider or Endocrinologist at least 2 times per year         Primary MD visit on 01/18/20, 08/09/20 ( telehealth visit)and 10/03/20( Telehealth visit) Endocrinologist visit  01/13/20       Plan:  Send successful outreach letter with a copy of their individualized care plan, Send individual care plan to provider and Send educational material  Chronic care management coordinator will outreach in:  3 Months     George Ina RN,BSN,CCM Chronic Care Management Coordinator [facility] Care Management [phone]    .

## 2020-11-19 ENCOUNTER — Other Ambulatory Visit: Payer: Self-pay

## 2020-11-19 NOTE — Patient Outreach (Signed)
  [facility]) Care Management Chronic Special Needs Program    11/19/2020  Name: [name], DOB: 09/02/1961  MRN: [id number]   Ms. [name] is enrolled in a chronic special needs plan for Diabetes.[facility] Care Management will continue to provide services for this member through 12/14/20.  The [facility] care management team will assume care 12/15/2020.   George Ina RN,BSN,CCM Chronic Care Management Coordinator [facility] Care Management [phone]

## 2020-12-11 ENCOUNTER — Other Ambulatory Visit: Payer: Self-pay | Admitting: *Deleted

## 2020-12-11 NOTE — Patient Outreach (Signed)
  [facility]) Care Management Chronic Special Needs Program    12/11/2020  Name: [name], DOB: 1961/07/07  MRN: [id number]   Ms. [name] is enrolled in a chronic special needs plan for Diabetes.  RN care manager received message/ referral that client is requesting a wheelchair ramp.  RN care manager spoke with client who reports she has not left her house in over a year and desperately needs a wheelchair ramp, client states she has tried multiple options with no results and would like Child psychotherapist to outreach her.    PLAN RN care manager placed order for social worker  Irving Shows [facility], BSN [facility] RN Care Coordinator, CSNP [phone]

## 2020-12-18 ENCOUNTER — Other Ambulatory Visit: Payer: Self-pay
